# Patient Record
Sex: Male | Born: 1975 | Hispanic: No | Marital: Married | Smoking: Never smoker
Health system: Southern US, Community
[De-identification: ages and names within clinical notes are randomized; demographics above are authoritative.]

---

## 2015-03-25 ENCOUNTER — Other Ambulatory Visit: Payer: Self-pay | Admitting: Infectious Diseases

## 2015-03-25 ENCOUNTER — Ambulatory Visit
Admission: RE | Admit: 2015-03-25 | Discharge: 2015-03-25 | Disposition: A | Discharge status: Home / Self Care | Payer: PRIVATE HEALTH INSURANCE | Source: Ambulatory Visit | Attending: Infectious Diseases | Admitting: Infectious Diseases

## 2015-03-25 DIAGNOSIS — A159 Respiratory tuberculosis unspecified: Secondary | ICD-10-CM

## 2015-03-25 DIAGNOSIS — J90 Pleural effusion, not elsewhere classified: Secondary | ICD-10-CM | POA: Insufficient documentation

## 2015-03-25 DIAGNOSIS — R590 Localized enlarged lymph nodes: Secondary | ICD-10-CM | POA: Insufficient documentation

## 2015-05-03 ENCOUNTER — Other Ambulatory Visit: Payer: Self-pay | Admitting: Family Medicine

## 2015-05-03 ENCOUNTER — Ambulatory Visit
Admission: RE | Admit: 2015-05-03 | Discharge: 2015-05-03 | Disposition: A | Discharge status: Home / Self Care | Payer: PRIVATE HEALTH INSURANCE | Source: Ambulatory Visit | Attending: Family Medicine | Admitting: Family Medicine

## 2015-05-03 DIAGNOSIS — J984 Other disorders of lung: Secondary | ICD-10-CM | POA: Diagnosis not present

## 2015-05-03 DIAGNOSIS — R7611 Nonspecific reaction to tuberculin skin test without active tuberculosis: Secondary | ICD-10-CM

## 2016-05-13 IMAGING — CR DG CHEST 2V
1 series · 2 of 2 positions shown · non-contrast
Comparison: None.

CLINICAL DATA: Active TD.  Four drug therapy.  No symptoms.

EXAM:
CHEST - 2 VIEW

[Series 1: w chest pa · 0.14mm/px · 2 of 2 slices shown]
[im 1/2]
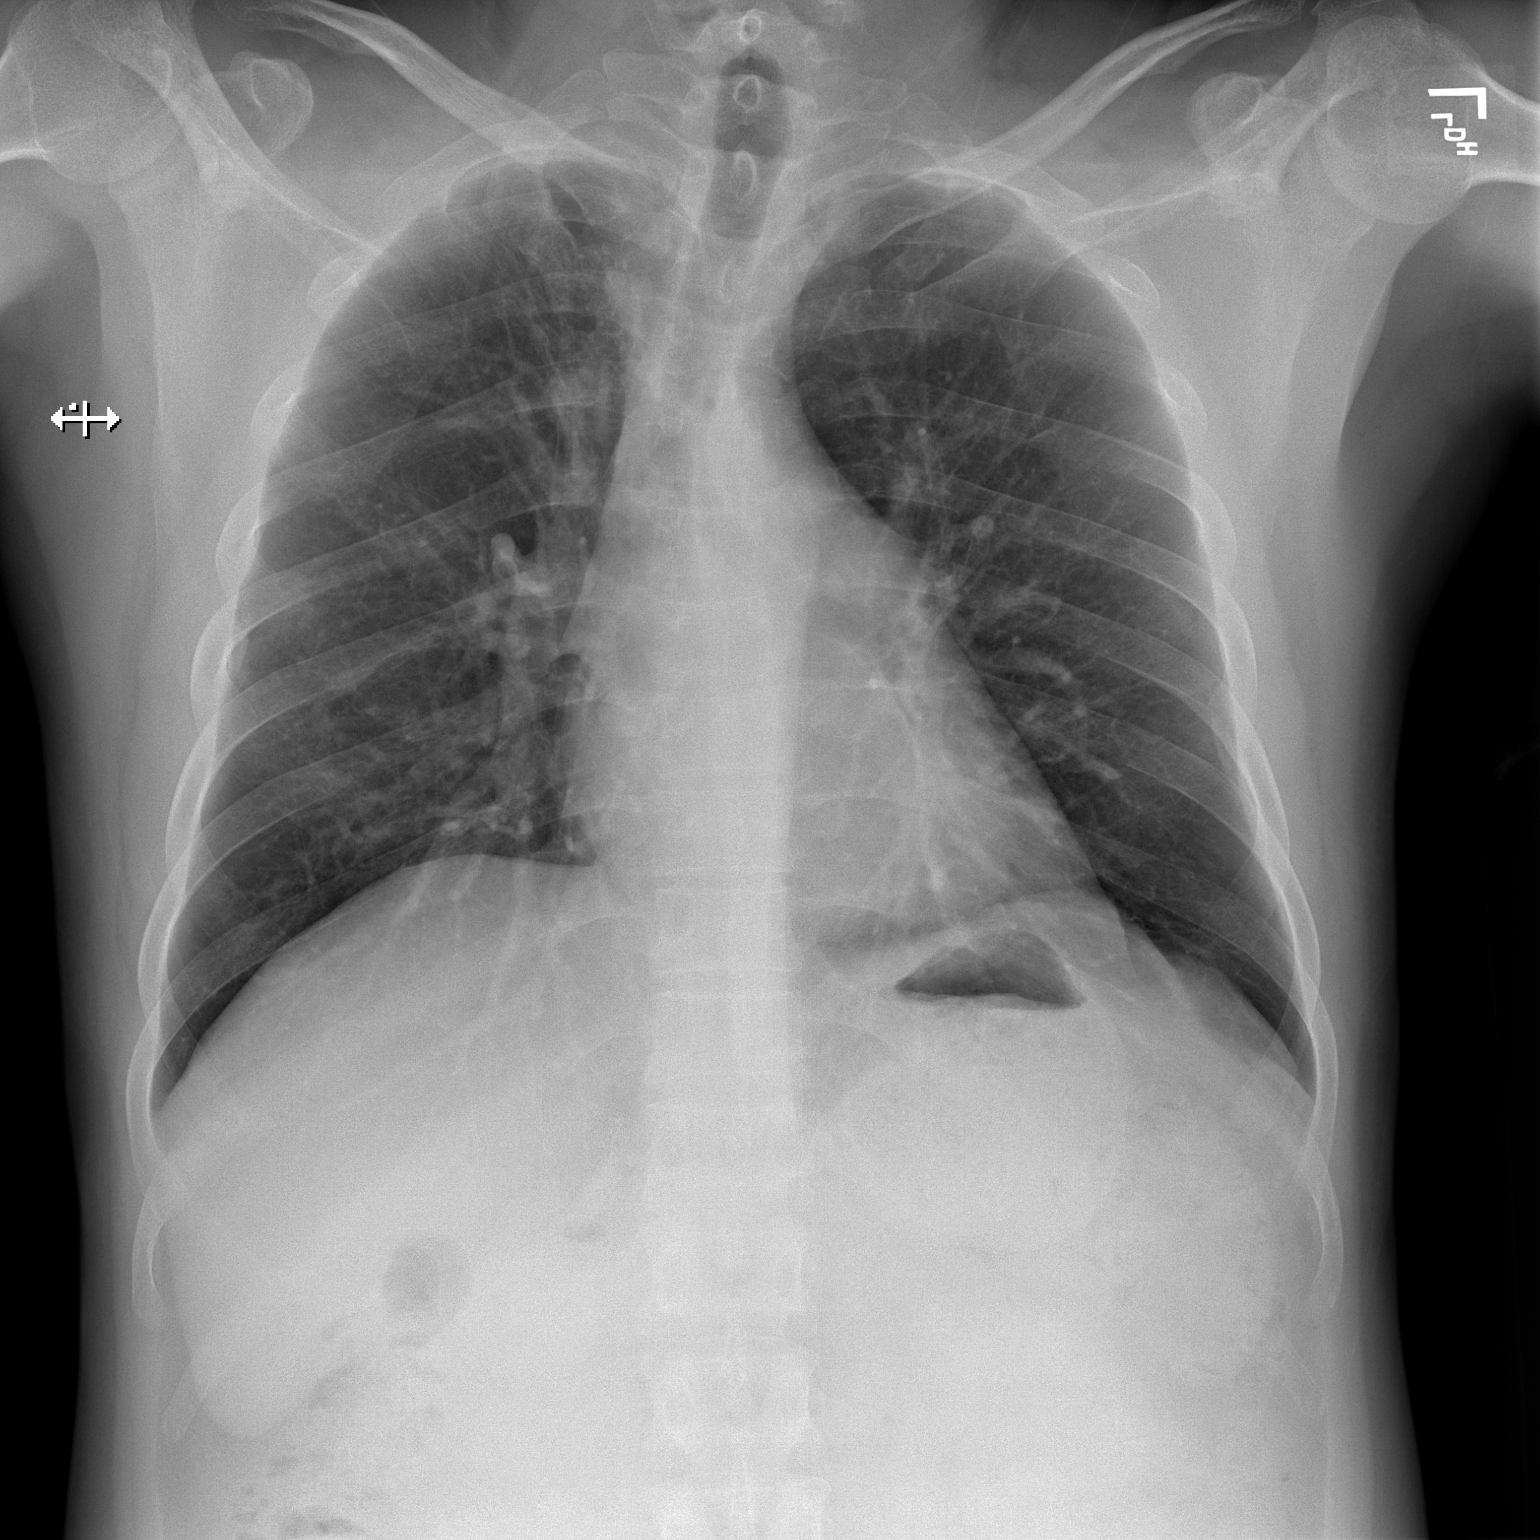
[im 2/2]
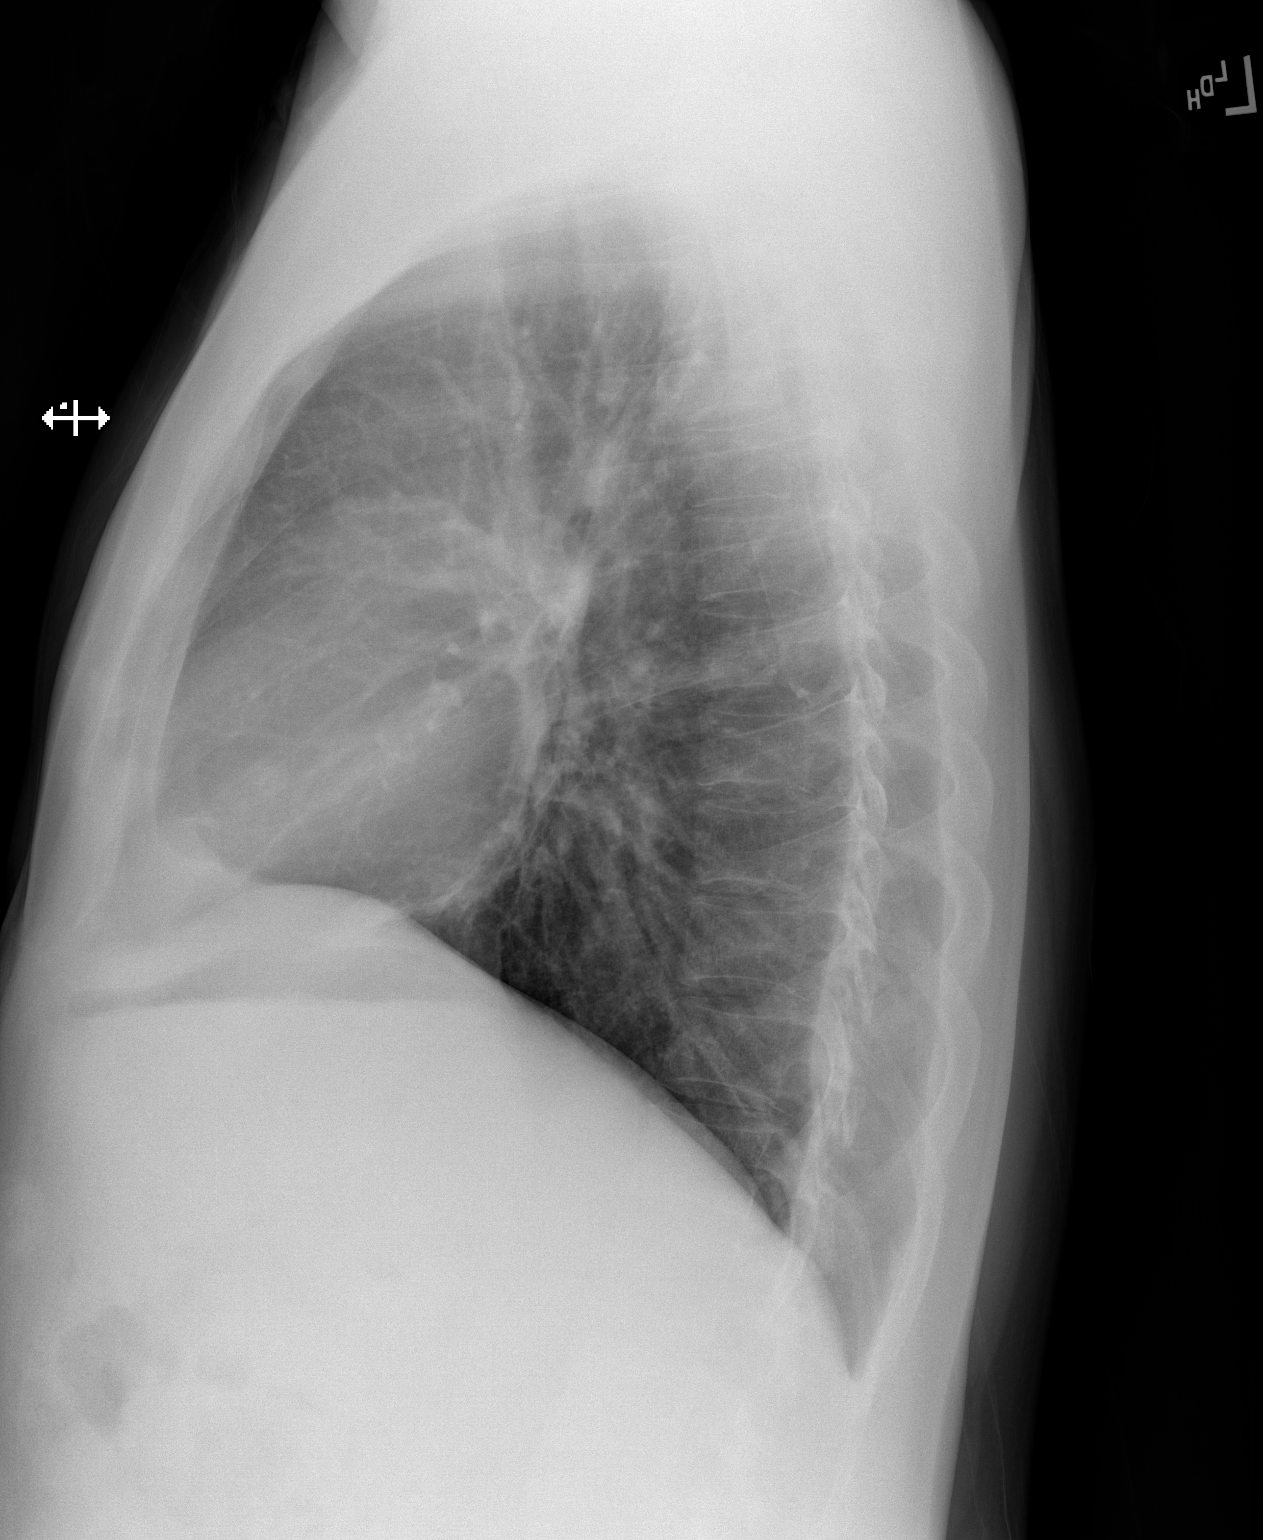

[2 of 2 positions shown; findings below may reference images not displayed]

FINDINGS: The heart size is normal. Increased interstitial markings are noted
in the right upper lobe without a definitive cavitary lesion. There
is mild prominence of the hila bilaterally. No significant airspace
consolidation is present.

Bilateral effusions are noted. Mild rightward curvature is present
in the upper thoracic spine.
IMPRESSION: 1. Asymmetric interstitial coarsening in the right upper lobe is
concerning for tuberculosis. No discrete cavitary lesion is present.
2. Mild hilar prominence bilaterally likely represents some degree
of adenopathy.

## 2017-11-11 ENCOUNTER — Other Ambulatory Visit: Payer: Self-pay

## 2017-11-11 ENCOUNTER — Ambulatory Visit
Admission: EM | Admit: 2017-11-11 | Discharge: 2017-11-11 | Disposition: A | Discharge status: Home / Self Care | Payer: PRIVATE HEALTH INSURANCE | Attending: Emergency Medicine | Admitting: Emergency Medicine

## 2017-11-11 ENCOUNTER — Encounter: Payer: Self-pay | Admitting: Emergency Medicine

## 2017-11-11 DIAGNOSIS — G44209 Tension-type headache, unspecified, not intractable: Secondary | ICD-10-CM | POA: Diagnosis not present

## 2017-11-11 MED ORDER — IBUPROFEN 600 MG PO TABS: 600.0000 mg | ORAL_TABLET | Freq: Four times a day (QID) | ORAL | 0 refills | Status: AC | PRN

## 2017-11-11 NOTE — ED Triage Notes (Signed)
Patient in today c/o headache x 1 day. Patient has not tried any OTC medication. Patient does state he has been sneezing and some dizziness.

## 2017-11-11 NOTE — ED Provider Notes (Addendum)
HPI  SUBJECTIVE:  Donald Mcgee is a 42 y.o. male who reports gradual onset throbbing burning temporal HA starting last night.  States that it feels like a "band around my head".  Intermittent, lasts hours.  Reports slight nausea.  Symptoms are better with sleeping.  He has not tried anything for this.  No aggravating factors.  No known fevers, Vomiting, photophobia, phonophobia, visual changes, nasal congestion, sinus pain/pressure, ear pain, jaw pain, purulent nasal d/c, dental pain,  dysarthria, focal weakness, facial droop, discoordination. No body aches, neck stiffness, rash. No allergy sx. No seizures, syncope. No sudden onset, did not occur during exertion. States is similar to previous HA. Drinks 1 L water a day.  Past medical history negative for DM, HTN, aneurysm, HIV, sinusitis, glaucoma, stroke, atrial fibrillation or temporal arteritis. Pt is not on any antiplatelets/anticoagulants. PMH: TB, treated. FH neg stroke. PMD: none  Patient refused language line although I offered to use it several times.  History reviewed. No pertinent past medical history.  History reviewed. No pertinent surgical history.  Family History  Problem Relation Age of Onset  . Liver disease Father     Social History   Tobacco Use  . Smoking status: Never Smoker  . Smokeless tobacco: Never Used  Substance Use Topics  . Alcohol use: Yes    Comment: socially  . Drug use: Never    No current facility-administered medications for this encounter.   Current Outpatient Medications:  .  ibuprofen (ADVIL,MOTRIN) 600 MG tablet, Take 1 tablet (600 mg total) by mouth every 6 (six) hours as needed., Disp: 30 tablet, Rfl: 0  No Known Allergies   ROS  As noted in HPI.   Physical Exam  BP (!) 139/93 (BP Location: Left Arm)   Pulse 92   Temp 98.3 F (36.8 C) (Oral)   Resp 16   Ht 5\' 6"  (1.676 m)   Wt 140 lb (63.5 kg)   SpO2 97%   BMI 22.60 kg/m   Constitutional: Well developed, well nourished,  no acute distress Eyes: PERRL, EOMI, conjunctiva normal bilaterally. Fundoscopic normal b/l. HENT: Normocephalic, atraumatic,mucus membranes moist, normal dentition.  TM normal b/l. No TMJ tenderness. Normal dentition. No nasal congestion, no sinus tenderness. No temporal artery tenderness.  Neck: no cervical LN + mild bilateral trapezial muscle tenderness. No meningismus Respiratory: normal inspiratory effort Cardiovascular: Normal rate GI:  nondistended skin: No rash, skin intact Musculoskeletal: No edema, no tenderness, no deformities Neurologic: Alert & oriented x 3, CN II-XII intact, romberg neg, finger-> nose, heel-> shin equal b/l, Romberg neg, tandem gait steady Psychiatric: Speech and behavior appropriate   ED Course  Medications - No data to display  No orders of the defined types were placed in this encounter.  No results found for this or any previous visit (from the past 24 hour(s)). No results found.   ED Clinical Impression  Acute non intractable tension-type headache  ED Assessment/Plan  Pt describing typical pain, no sudden onset. Doubt SAH, ICH or space occupying lesion. Pt without fevers/chills, Pt has no meningeal sx, no nuchal rigidity. Doubt meningitis. Pt with normal neuro exam, no evidence of CVA/TIA.  Pt BP not elevated significantly, doubt hypertensive emergency. No evidence of temporal artery tenderness, no evidence of glaucoma or other ocular pathology.  Patient's presentation is most consistent with a tension headache.  Will send home with ibuprofen 600 mg to take with 1 g of Tylenol 3 or 4 times a day, advised patient to push electrolyte  containing fluids.  We will write a work note for today and tomorrow.  Providing a primary care referral list and will order primary care follow-up for routine care.  Discussed MDM, plan for follow up, signs and sx that should prompt return to ER. Pt agrees with plan  Meds ordered this encounter  Medications  .  ibuprofen (ADVIL,MOTRIN) 600 MG tablet    Sig: Take 1 tablet (600 mg total) by mouth every 6 (six) hours as needed.    Dispense:  30 tablet    Refill:  0    *This clinic note was created using Scientist, clinical (histocompatibility and immunogenetics). Therefore, there may be occasional mistakes despite careful proofreading.  ?   Domenick Gong, MD 11/11/17 1622    Domenick Gong, MD 11/11/17 [phone]

## 2017-11-11 NOTE — Discharge Instructions (Addendum)
Take 600 mg of ibuprofen with 1 g of Tylenol 3 or 4 times a day.  Take this together.  This is an effective combination for pain.  Make sure you drink at least 2 L of nonalcoholic, non-caffeinated, non-sugary fluids a day.  Follow-up with the primary care physician of your choice, and go to the ER for the signs and symptoms we discussed.  Here is a list of primary care providers who are taking new patients:  Dr. Elizabeth Sauereanna Jones, Dr. Schuyler AmorWilliam Plonk [address] [place] [address] [phone]  [facility] [place] [address] [place] [address]  [place] [address]  [phone]  [facility] [address]  [place], [place] [address] [phone]  [facility] [place] [address]  [phone] [place], [place] [address]  Here are clinics/ other resources who will see you if you do not have insurance. Some have certain criteria that you must meet. Call them and find out what they are:  [facility]: [address] [place] [place], [place] [address] Phone: [phone] Hours: First and Third Saturdays of each Month, 9 a.m. - 1 p.m.  [facility]: [address], [place], [place] [address] Phone: [phone] Hours: Tuesday, 4 p.m. - 8 p.m. Thursday, 1 p.m. - 8 p.m. Wednesday, 9 a.m. - [facility]  [facility] [address], [place], [place] [address] Phone: [phone] Pharmacy Phone Number: [phone] Dental Phone Number: [phone] [facility] Insurance Help: [phone]  Dental Hours: Monday - Thursday, 8 a.m. - 6 p.m.  [facility] [address] [place], [place] [address] Phone: [phone] Pharmacy Phone Number: [phone] [facility] Insurance Help: [phone]  [facility] [address] [place], [place] [address] Phone: [phone] Pharmacy Phone Number: [phone] [facility] Insurance Help: [phone]  [facility] [address] [place], [place]  [address] Phone: [phone] [facility] Insurance Help: [phone]   [facility] [address] [place], [place] [address] Phone: [phone]  Go to www.goodrx.com to look up your medications. This will give you a list of where you can find your prescriptions at the most affordable prices. Or ask the pharmacist what the cash price is, or if they have any other discount programs available to help make your medication more affordable. This can be less expensive than what you would pay with insurance.

## 2023-08-24 DEATH — deceased
